# Patient Record
Sex: Male | Born: 1960 | Race: White | Hispanic: No | Marital: Single | Smoking: Current every day smoker
Health system: Southern US, Community
[De-identification: ages and names within clinical notes are randomized; demographics above are authoritative.]

---

## 2017-08-12 ENCOUNTER — Emergency Department (HOSPITAL_COMMUNITY): Payer: Self-pay

## 2017-08-12 ENCOUNTER — Emergency Department (HOSPITAL_COMMUNITY)
Admission: EM | Admit: 2017-08-12 | Discharge: 2017-08-12 | Disposition: A | Discharge status: Home / Self Care | Payer: Self-pay | Attending: Emergency Medicine | Admitting: Emergency Medicine

## 2017-08-12 ENCOUNTER — Other Ambulatory Visit: Payer: Self-pay

## 2017-08-12 ENCOUNTER — Encounter (HOSPITAL_COMMUNITY): Payer: Self-pay | Admitting: Emergency Medicine

## 2017-08-12 DIAGNOSIS — Y929 Unspecified place or not applicable: Secondary | ICD-10-CM | POA: Insufficient documentation

## 2017-08-12 DIAGNOSIS — Y999 Unspecified external cause status: Secondary | ICD-10-CM | POA: Insufficient documentation

## 2017-08-12 DIAGNOSIS — F17200 Nicotine dependence, unspecified, uncomplicated: Secondary | ICD-10-CM | POA: Insufficient documentation

## 2017-08-12 DIAGNOSIS — W1789XA Other fall from one level to another, initial encounter: Secondary | ICD-10-CM | POA: Insufficient documentation

## 2017-08-12 DIAGNOSIS — S92002A Unspecified fracture of left calcaneus, initial encounter for closed fracture: Secondary | ICD-10-CM | POA: Insufficient documentation

## 2017-08-12 DIAGNOSIS — Y939 Activity, unspecified: Secondary | ICD-10-CM | POA: Insufficient documentation

## 2017-08-12 MED ORDER — OXYCODONE-ACETAMINOPHEN 5-325 MG PO TABS: 1.0000 | ORAL_TABLET | ORAL | 0 refills | Status: AC | PRN

## 2017-08-12 MED ORDER — KETOROLAC TROMETHAMINE 30 MG/ML IJ SOLN
30.0000 mg | Freq: Once | INTRAMUSCULAR | Status: AC
  Administered 2017-08-12: 30 mg via INTRAMUSCULAR
  Filled 2017-08-12: qty 1

## 2017-08-12 MED ORDER — OXYCODONE-ACETAMINOPHEN 5-325 MG PO TABS
1.0000 | ORAL_TABLET | Freq: Once | ORAL | Status: AC
  Administered 2017-08-12: 1 via ORAL
  Filled 2017-08-12: qty 1

## 2017-08-12 NOTE — ED Notes (Signed)
Pt has his own crutches from home.

## 2017-08-12 NOTE — Progress Notes (Signed)
Orthopedic Tech Progress Note Patient Details:  Antonio Greene Dec 08, 1960 [id number]  Ortho Devices Type of Ortho Device: Ace wrap, Post (short leg) splint Ortho Device/Splint Location: Jones dressing with short leg splint Ortho Device/Splint Interventions: Application   Post Interventions Patient Tolerated: Well Instructions Provided: Care of device   Saul Fordyce 08/12/2017, 2:12 PM

## 2017-08-12 NOTE — ED Provider Notes (Signed)
[facility] EMERGENCY DEPARTMENT Provider Note   CSN: [id number] Arrival date & time: 08/12/17  1029     History   Chief Complaint Chief Complaint  Patient presents with  . Fall  . Foot Pain  . Ankle Pain    HPI Antonio Greene is a 57 y.o. male who presents with left ankle and left foot pain.  He states that on Thursday he was on a cherry picker and the truck turned off.  There is a no one around to help him therefore he tried to jump out of the cherry picker by grabbing onto a rope and sliding down.  He lost his grip from the rope and fell approximately 10 feet onto the ground.  He initially thought that he sprained his ankle.  Over the past couple days he has had worsening pain and swelling and more difficulty walking.  The pain is over the bilateral ankles and over the heel bone. He denies back pain. Patient states that he is a heavy drinker and smoker and does not have a primary care provider.  HPI  History reviewed. No pertinent past medical history.  There are no active problems to display for this patient.   History reviewed. No pertinent surgical history.      Home Medications    Prior to Admission medications   Not on File    Family History No family history on file.  Social History Social History   Tobacco Use  . Smoking status: Current Every Day Smoker  . Smokeless tobacco: Current User  Substance Use Topics  . Alcohol use: Yes  . Drug use: Yes    Types: Marijuana     Allergies   Patient has no allergy information on record.   Review of Systems Review of Systems  Musculoskeletal: Positive for arthralgias, gait problem and joint swelling.  Neurological: Negative for weakness and numbness.     Physical Exam Updated Vital Signs BP 140/90 (BP Location: Right Arm)   Pulse 73   Temp 98.3 F (36.8 C) (Oral)   Resp 17   Ht 5' 9.5" (1.765 m)   Wt 68.9 kg (152 lb)   SpO2 99%   BMI 22.12 kg/m   Physical Exam    Constitutional: He is oriented to person, place, and time. He appears well-developed and well-nourished. No distress.  HENT:  Head: Normocephalic and atraumatic.  Eyes: Pupils are equal, round, and reactive to light. Conjunctivae are normal. Right eye exhibits no discharge. Left eye exhibits no discharge. No scleral icterus.  Neck: Normal range of motion.  Cardiovascular: Normal rate.  Pulmonary/Chest: Effort normal. No respiratory distress.  Abdominal: He exhibits no distension.  Musculoskeletal:  Left ankle and foot: There is diffuse swelling. There is tenderness over the calcaneus and bilateral ankle. No forefoot or toe tenderness. The toes feel cool compared to the right foot. Pulse was doppler-able. No calf tenderness.   Neurological: He is alert and oriented to person, place, and time.  Skin: Skin is warm and dry.  Psychiatric: He has a normal mood and affect. His behavior is normal.  Nursing note and vitals reviewed.    ED Treatments / Results  Labs (all labs ordered are listed, but only abnormal results are displayed) Labs Reviewed - No data to display  EKG None  Radiology Dg Lumbar Spine Complete  Result Date: 08/12/2017 CLINICAL DATA:  Patient jumped 10 feet from truck 2 ground. EXAM: LUMBAR SPINE - COMPLETE 4+ VIEW COMPARISON:  None.  FINDINGS: There is mild spondylosis throughout the lumbar spine. There is a grade 1 anterolisthesis of L5 with respect that L4 and S1. Findings suggest a chronic bilateral L5 spondylolysis. No evidence of compression fracture. Disc space narrowing at the L5-S1 level. IMPRESSION: No acute findings. Grade 1 anterolisthesis of L5 with respect to L4 and S1 likely due to chronic bilateral L5 spondylolysis. Minimal spondylosis of the lumbar spine. Disc disease at the L5-S1 level. Electronically Signed   By: Elberta Fortis M.D.   On: 08/12/2017 13:15   Dg Ankle Complete Left  Result Date: 08/12/2017 CLINICAL DATA:  Left foot and ankle pain after  fall. EXAM: LEFT FOOT - COMPLETE 3+ VIEW; LEFT ANKLE COMPLETE - 3+ VIEW COMPARISON:  None. FINDINGS: Essentially nondisplaced fracture of the calcaneal tuberosity. No calcaneal depression. The subtalar joints are unremarkable. The ankle mortise is symmetric. The talar dome is intact. No tibiotalar joint effusion. Minimal first MTP joint osteoarthritis. Remaining joint spaces are relatively preserved. Mild osteopenia. Moderate soft tissue swelling about the hindfoot. IMPRESSION: 1. Essentially nondisplaced fracture of the calcaneal tuberosity. Electronically Signed   By: Obie Dredge M.D.   On: 08/12/2017 12:06   Dg Foot Complete Left  Result Date: 08/12/2017 CLINICAL DATA:  Left foot and ankle pain after fall. EXAM: LEFT FOOT - COMPLETE 3+ VIEW; LEFT ANKLE COMPLETE - 3+ VIEW COMPARISON:  None. FINDINGS: Essentially nondisplaced fracture of the calcaneal tuberosity. No calcaneal depression. The subtalar joints are unremarkable. The ankle mortise is symmetric. The talar dome is intact. No tibiotalar joint effusion. Minimal first MTP joint osteoarthritis. Remaining joint spaces are relatively preserved. Mild osteopenia. Moderate soft tissue swelling about the hindfoot. IMPRESSION: 1. Essentially nondisplaced fracture of the calcaneal tuberosity. Electronically Signed   By: Obie Dredge M.D.   On: 08/12/2017 12:06    Procedures Procedures (including critical care time)  Medications Ordered in ED Medications  oxyCODONE-acetaminophen (PERCOCET/ROXICET) 5-325 MG per tablet 1 tablet (1 tablet Oral Given 08/12/17 1224)  ketorolac (TORADOL) 30 MG/ML injection 30 mg (30 mg Intramuscular Given 08/12/17 1224)     Initial Impression / Assessment and Plan / ED Course  I have reviewed the triage vital signs and the nursing notes.  Pertinent labs & imaging results that were available during my care of the patient were reviewed by me and considered in my medical decision making (see chart for  details).  57 year old male presents with left foot and ankle pain after a fall from 10 feet several days ago.  He is mildly hypertensive but otherwise vital signs are normal.  His ankle and foot is obviously swollen.  X-ray confirms a nondisplaced fracture of the calcaneus.  Additional x-ray of the lumbar spine is negative.  Patient was given pain control and placed in a bulky splint.  He is advised to rest and elevate as much as possible over the next couple of days.  He was given prescription for pain medicine and advised to call Dr. Greig Right office tomorrow morning.  Final Clinical Impressions(s) / ED Diagnoses   Final diagnoses:  Closed nondisplaced fracture of left calcaneus, unspecified portion of calcaneus, initial encounter    ED Discharge Orders    None       Bethel Born, PA-C 08/12/17 1408    Loren Racer, MD 08/16/17 1315

## 2017-08-12 NOTE — Discharge Instructions (Signed)
Please rest and elevate the leg as much as possible for the next several days Do not put weight on the foot until you see Dr. Eulah Pont with orthopedics Take Ibuprofen for pain and swelling Take Percocet for severe pain Return if worsening

## 2017-08-12 NOTE — ED Triage Notes (Signed)
Pt. Stated, I fell from a bucket truck after I grab a rope to get down. On Thursday, Im having foot and ankle pain, Happened on Thursday.

## 2017-08-12 NOTE — ED Notes (Signed)
Ortho tech at bedside 

## 2019-06-04 IMAGING — CR DG FOOT COMPLETE 3+V*L*
1 series · 1 of 1 positions shown · non-contrast
Comparison: None.

CLINICAL DATA: Left foot and ankle pain after fall.

EXAM:
LEFT FOOT - COMPLETE 3+ VIEW; LEFT ANKLE COMPLETE - 3+ VIEW

[ankle lat]
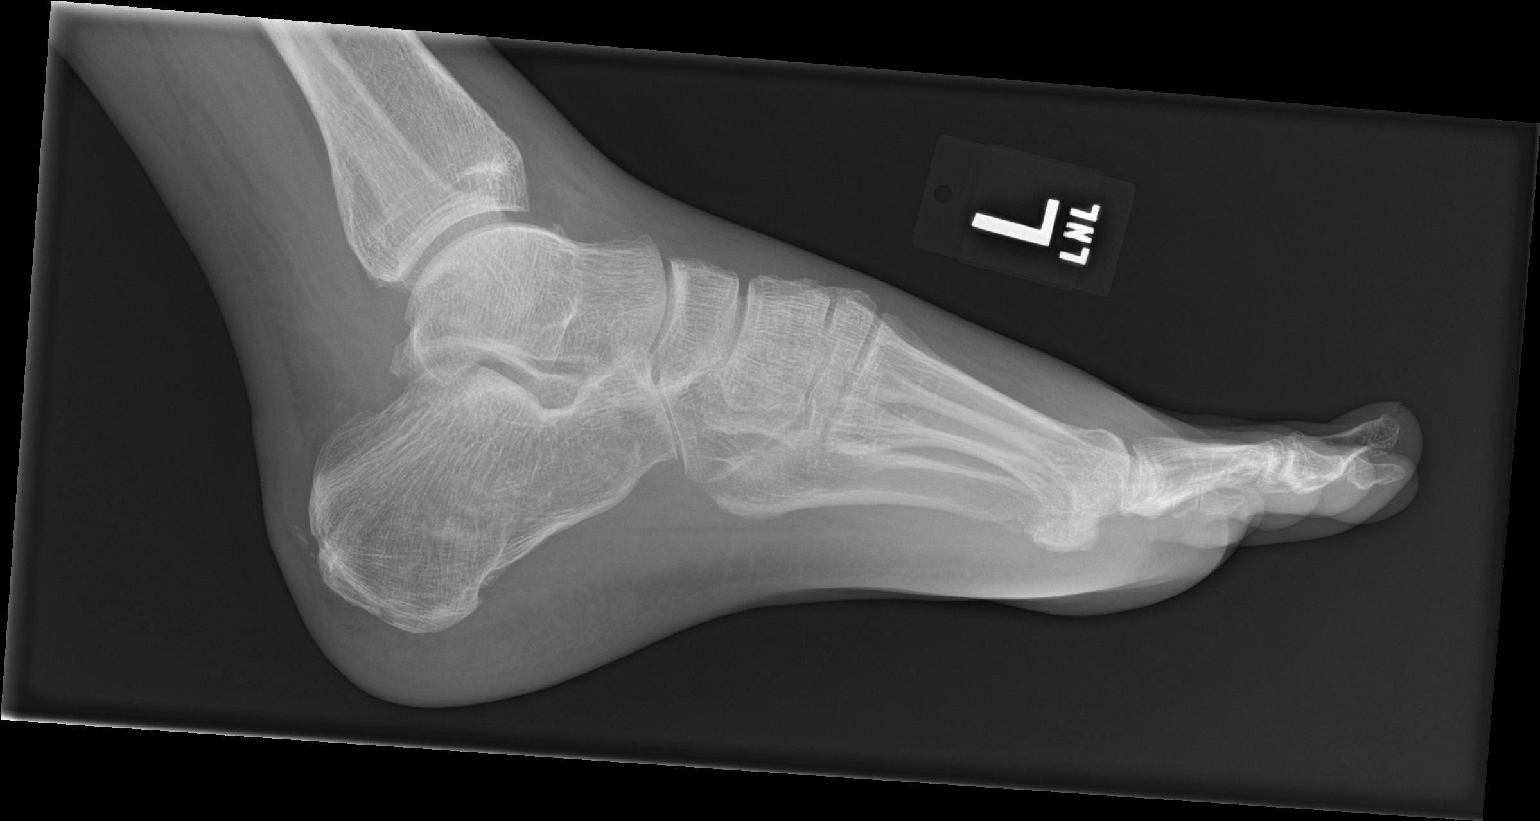

[1 of 1 positions shown; findings below may reference images not displayed]

FINDINGS: Essentially nondisplaced fracture of the calcaneal tuberosity. No
calcaneal depression. The subtalar joints are unremarkable. The
ankle mortise is symmetric. The talar dome is intact. No tibiotalar
joint effusion. Minimal first MTP joint osteoarthritis. Remaining
joint spaces are relatively preserved. Mild osteopenia. Moderate
soft tissue swelling about the hindfoot.
IMPRESSION: 1. Essentially nondisplaced fracture of the calcaneal tuberosity.

## 2019-06-04 IMAGING — DX DG LUMBAR SPINE COMPLETE 4+V
5 series · 5 of 5 positions shown · non-contrast
Comparison: None.

CLINICAL DATA: Patient jumped 10 feet from truck 2 ground.

EXAM:
LUMBAR SPINE - COMPLETE 4+ VIEW

[l-spine ap]
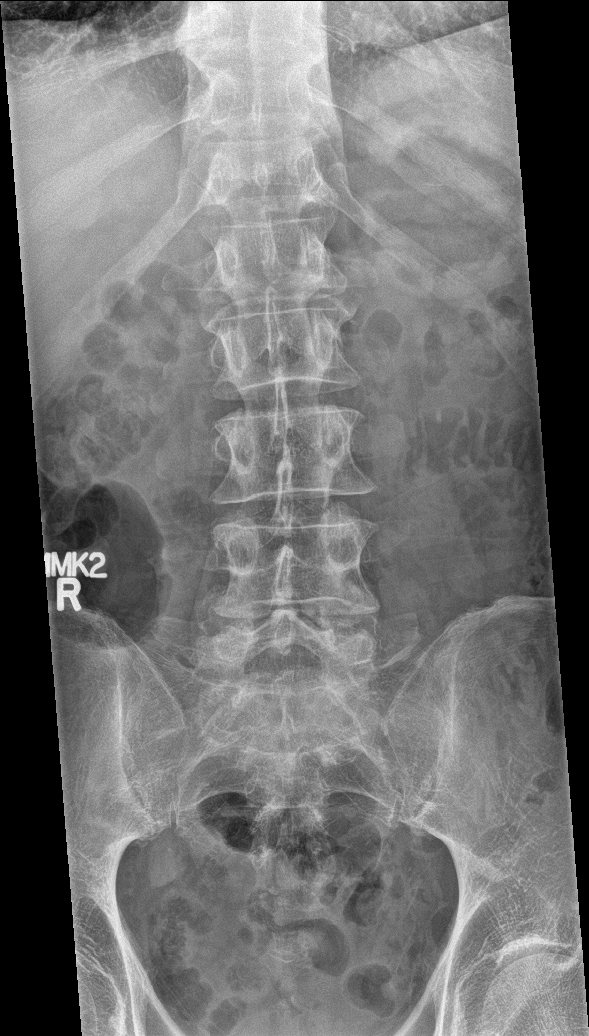

[l-spine obl (1 of 2)]
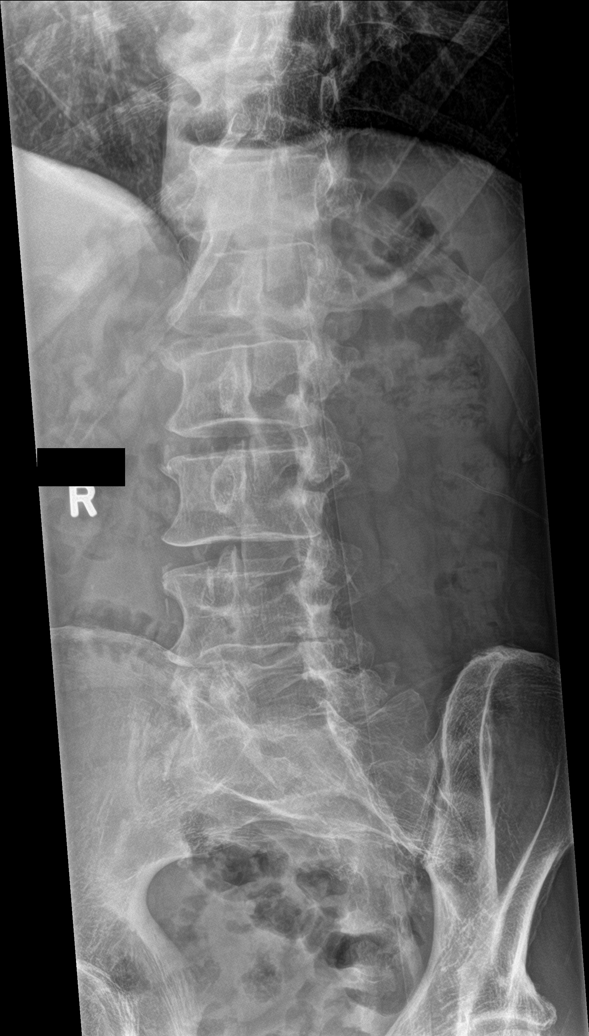

[l-spine obl (2 of 2)]
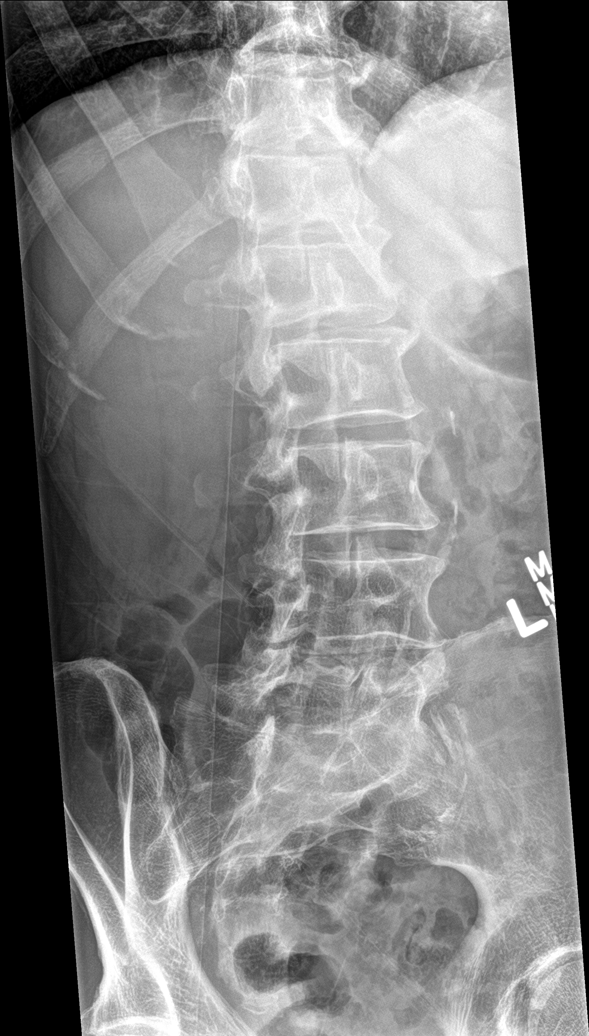

[l-spine lat]
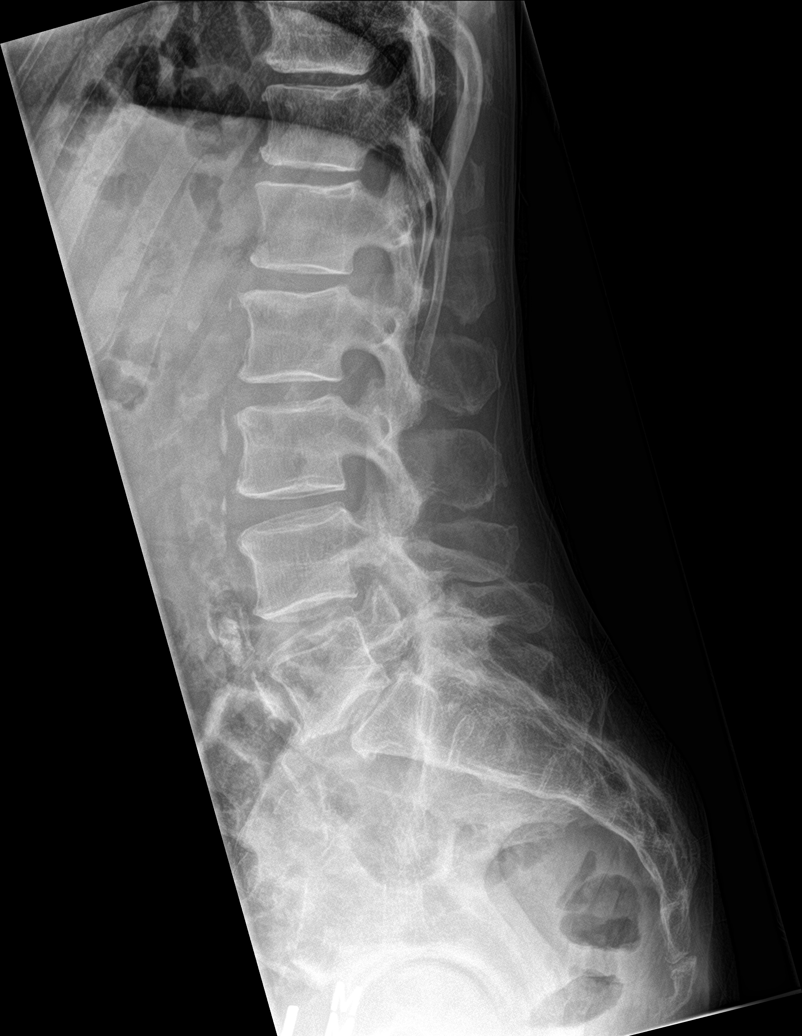

[l-spine spot]
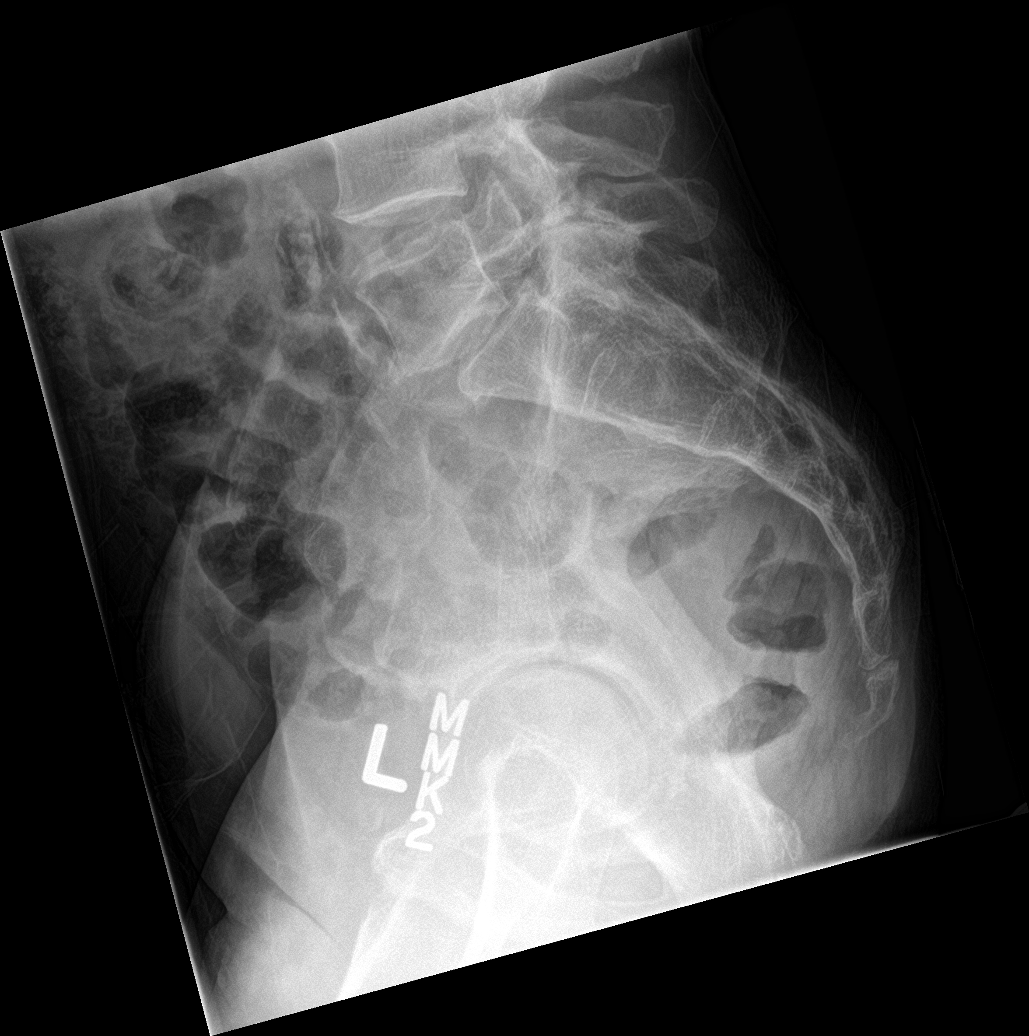

[5 of 5 positions shown; findings below may reference images not displayed]

FINDINGS: There is mild spondylosis throughout the lumbar spine. There is a
grade 1 anterolisthesis of L5 with respect that L4 and S1. Findings
suggest a chronic bilateral L5 spondylolysis. No evidence of
compression fracture. Disc space narrowing at the L5-S1 level.
IMPRESSION: No acute findings.

Grade 1 anterolisthesis of L5 with respect to L4 and S1 likely due
to chronic bilateral L5 spondylolysis.

Minimal spondylosis of the lumbar spine. Disc disease at the L5-S1
level.

## 2019-06-04 IMAGING — CR DG ANKLE COMPLETE 3+V*L*
1 series · 1 of 1 positions shown · non-contrast
Comparison: None.

CLINICAL DATA: Left foot and ankle pain after fall.

EXAM:
LEFT FOOT - COMPLETE 3+ VIEW; LEFT ANKLE COMPLETE - 3+ VIEW

[foot obl]
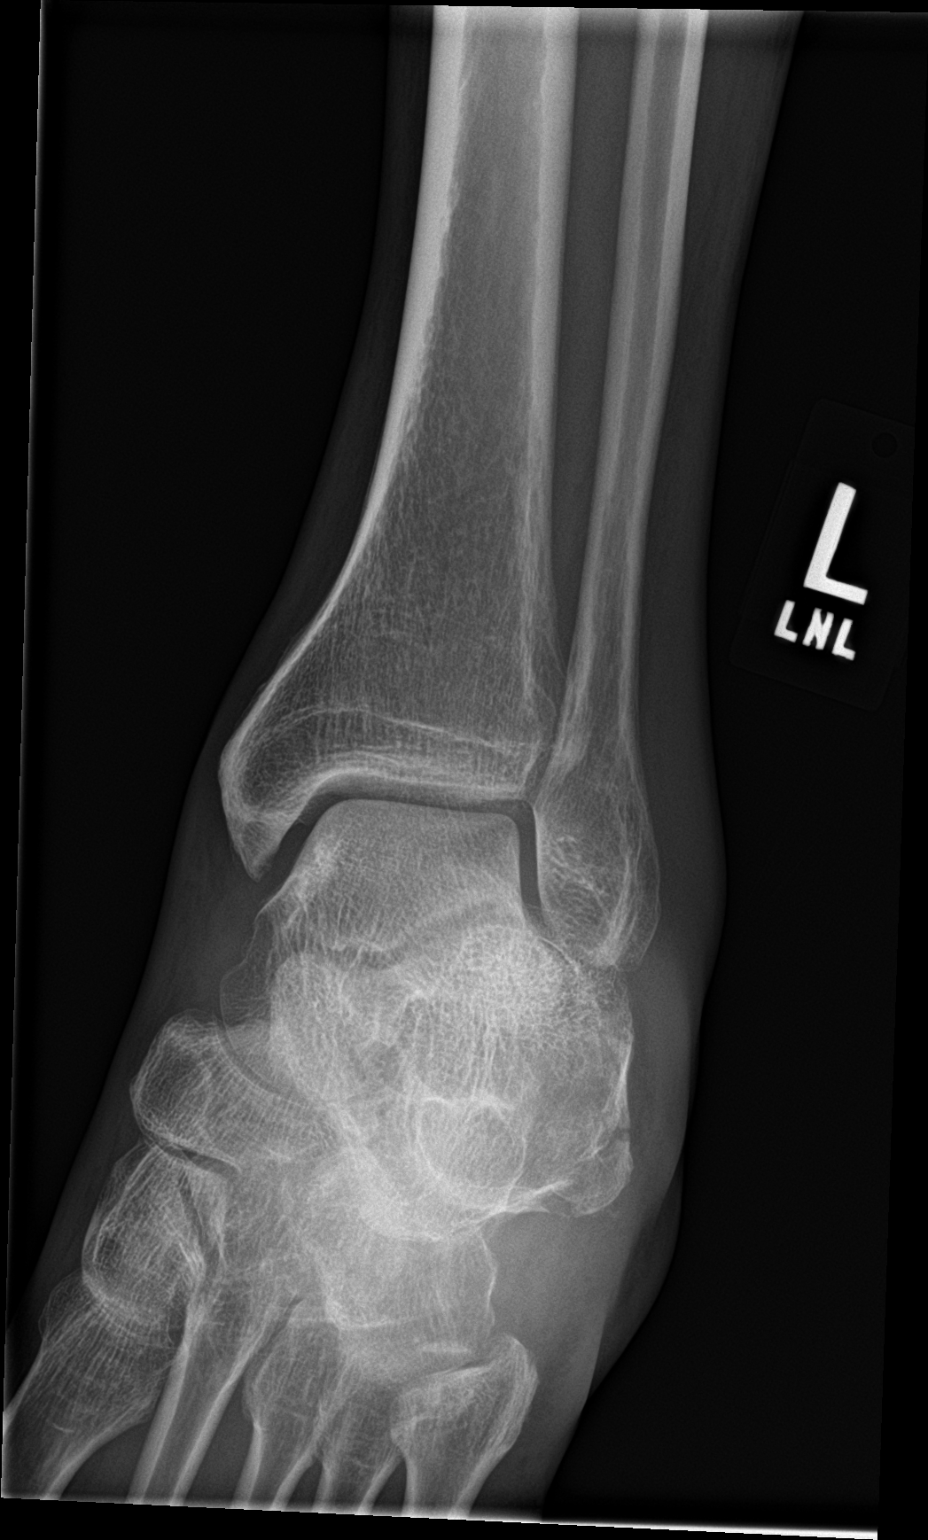

[1 of 1 positions shown; findings below may reference images not displayed]

FINDINGS: Essentially nondisplaced fracture of the calcaneal tuberosity. No
calcaneal depression. The subtalar joints are unremarkable. The
ankle mortise is symmetric. The talar dome is intact. No tibiotalar
joint effusion. Minimal first MTP joint osteoarthritis. Remaining
joint spaces are relatively preserved. Mild osteopenia. Moderate
soft tissue swelling about the hindfoot.
IMPRESSION: 1. Essentially nondisplaced fracture of the calcaneal tuberosity.
